# Patient Record
Sex: Female | Born: 2000 | Race: Black or African American | Hispanic: No | Marital: Single | Smoking: Never smoker
Health system: Southern US, Community
[De-identification: ages and names within clinical notes are randomized; demographics above are authoritative.]

---
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
## 2019-10-28 ENCOUNTER — Encounter: Payer: Self-pay | Admitting: Pediatrics

## 2020-10-26 ENCOUNTER — Other Ambulatory Visit: Payer: Self-pay

## 2020-10-26 ENCOUNTER — Encounter (HOSPITAL_COMMUNITY): Payer: Self-pay

## 2020-10-26 ENCOUNTER — Ambulatory Visit (HOSPITAL_COMMUNITY)
Admission: EM | Admit: 2020-10-26 | Discharge: 2020-10-26 | Disposition: A | Discharge status: Home / Self Care | Payer: Medicaid Other | Attending: Physician Assistant | Admitting: Physician Assistant

## 2020-10-26 DIAGNOSIS — S0181XA Laceration without foreign body of other part of head, initial encounter: Secondary | ICD-10-CM

## 2020-10-26 DIAGNOSIS — Z23 Encounter for immunization: Secondary | ICD-10-CM | POA: Diagnosis not present

## 2020-10-26 MED ORDER — TETANUS-DIPHTH-ACELL PERTUSSIS 5-2.5-18.5 LF-MCG/0.5 IM SUSY
PREFILLED_SYRINGE | INTRAMUSCULAR | Status: AC
  Filled 2020-10-26: qty 0.5

## 2020-10-26 MED ORDER — LIDOCAINE HCL (PF) 1 % IJ SOLN
INTRAMUSCULAR | Status: AC
  Filled 2020-10-26: qty 30

## 2020-10-26 MED ORDER — TETANUS-DIPHTH-ACELL PERTUSSIS 5-2.5-18.5 LF-MCG/0.5 IM SUSY
0.5000 mL | PREFILLED_SYRINGE | Freq: Once | INTRAMUSCULAR | Status: AC
  Administered 2020-10-26: 0.5 mL via INTRAMUSCULAR

## 2020-10-26 NOTE — ED Provider Notes (Signed)
[facility]    CSN: [id number] Arrival date & time: 10/26/20  1030      History   Chief Complaint Chief Complaint  Patient presents with   Laceration    HPI Krystal Young is a 20 y.o. female.   Patient presents today with a laceration above her right eye that occurred at approximately 1 AM (12 hours ago).  Reports that she was running away from a bug when she hit her head on a doorknob causing a laceration.  She immediately cleaned the area and then went to bed.  She denies any vision changes, headache, dizziness, loss of consciousness, nausea, vomiting, amnesia surrounding event, difficulty with extraocular movements.     History reviewed. No pertinent past medical history.  Patient Active Problem List   Diagnosis Date Noted   Open left ankle fracture 09/07/2014   Learning disability 12/17/2012   ADHD (attention deficit hyperactivity disorder) 10/21/2012   Prematurity 10/21/2012   Behavior problem 10/21/2012   Receptive expressive language disorder 10/21/2012    History reviewed. No pertinent surgical history.  OB History   No obstetric history on file.      Home Medications    Prior to Admission medications   Medication Sig Start Date End Date Taking? Authorizing Provider  dexmethylphenidate (FOCALIN XR) 5 MG 24 hr capsule Take 1 capsule (5 mg total) by mouth daily. 11/02/12   Leatha Gilding, MD  dexmethylphenidate (FOCALIN XR) 5 MG 24 hr capsule Take 1 capsule (5 mg total) by mouth daily. 11/02/12   Leatha Gilding, MD  guanFACINE (INTUNIV) 1 MG TB24 Take 1 tablet (1 mg total) by mouth 2 (two) times daily. 11/02/12   Leatha Gilding, MD    Family History Family History  Problem Relation Age of Onset   Stroke Paternal Uncle        in early 34's    Social History Social History   Tobacco Use   Smoking status: Never    Passive exposure: Yes   Smokeless tobacco: Never  Substance Use Topics   Alcohol use: No     Allergies   Patient has no known  allergies.   Review of Systems Review of Systems  Constitutional:  Negative for activity change, appetite change and fatigue.  Eyes:  Negative for photophobia, discharge, redness and visual disturbance.  Respiratory:  Negative for cough and shortness of breath.   Cardiovascular:  Negative for chest pain.  Gastrointestinal:  Negative for abdominal pain, diarrhea, nausea and vomiting.  Skin:  Positive for wound.  Neurological:  Negative for dizziness, light-headedness and headaches.    Physical Exam Triage Vital Signs ED Triage Vitals  Enc Vitals Group     BP 10/26/20 1229 128/81     Pulse Rate 10/26/20 1229 62     Resp 10/26/20 1229 17     Temp 10/26/20 1229 98.6 F (37 C)     Temp Source 10/26/20 1229 Oral     SpO2 10/26/20 1229 98 %     Weight --      Height --      Head Circumference --      Peak Flow --      Pain Score 10/26/20 1232 0     Pain Loc --      Pain Edu? --      Excl. in GC? --    No data found.  Updated Vital Signs BP 128/81 (BP Location: Left Arm)   Pulse 62   Temp  98.6 F (37 C) (Oral)   Resp 17   LMP 10/12/2020   SpO2 98%   Visual Acuity Right Eye Distance:   Left Eye Distance:   Bilateral Distance:    Right Eye Near:   Left Eye Near:    Bilateral Near:     Physical Exam Vitals reviewed.  Constitutional:      General: She is awake. She is not in acute distress.    Appearance: Normal appearance. She is normal weight. She is not ill-appearing.  HENT:     Head: Normocephalic and atraumatic.  Cardiovascular:     Rate and Rhythm: Normal rate and regular rhythm.     Heart sounds: Normal heart sounds, S1 normal and S2 normal. No murmur heard. Pulmonary:     Effort: Pulmonary effort is normal.     Breath sounds: Normal breath sounds. No wheezing, rhonchi or rales.  Abdominal:     Palpations: Abdomen is soft.     Tenderness: There is no abdominal tenderness.  Skin:    Findings: Laceration present.     Comments: 3 cm laceration noted  superior eyelid.  Psychiatric:        Behavior: Behavior is cooperative.        UC Treatments / Results  Labs (all labs ordered are listed, but only abnormal results are displayed) Labs Reviewed - No data to display  EKG   Radiology No results found.  Procedures Laceration Repair  Date/Time: 10/26/2020 1:25 PM Performed by: Jeani Hawking, PA-C Authorized by: Jeani Hawking, PA-C   Consent:    Consent obtained:  Verbal   Consent given by:  Patient   Risks, benefits, and alternatives were discussed: yes     Risks discussed:  Pain, need for additional repair and poor cosmetic result   Alternatives discussed:  No treatment, observation and referral Universal protocol:    Procedure explained and questions answered to patient or proxy's satisfaction: yes     Patient identity confirmed:  Verbally with patient Anesthesia:    Anesthesia method:  Local infiltration   Local anesthetic:  Lidocaine 1% w/o epi Laceration details:    Location:  Face   Face location:  R upper eyelid   Extent:  Superficial   Length (cm):  3 Pre-procedure details:    Preparation:  Patient was prepped and draped in usual sterile fashion Exploration:    Hemostasis achieved with:  Direct pressure   Contaminated: no   Treatment:    Area cleansed with:  Chlorhexidine   Amount of cleaning:  Standard   Irrigation solution:  Sterile saline   Irrigation volume:  5 mL   Irrigation method:  Syringe   Debridement:  None   Undermining:  None Skin repair:    Repair method:  Sutures   Suture size:  7-0   Suture material:  Prolene   Suture technique:  Simple interrupted   Number of sutures:  3 Approximation:    Approximation:  Close Repair type:    Repair type:  Simple Post-procedure details:    Dressing:  Open (no dressing)   Procedure completion:  Tolerated well, no immediate complications (including critical care time)  Medications Ordered in UC Medications  Tdap (BOOSTRIX) injection 0.5 mL  (has no administration in time range)    Initial Impression / Assessment and Plan / UC Course  I have reviewed the triage vital signs and the nursing notes.  Pertinent labs & imaging results that were available during my care of the  patient were reviewed by me and considered in my medical decision making (see chart for details).     Laceration repaired in clinic (see procedure note above).  Patient encouraged to keep area clean.  Tetanus was updated today.  Discussed signs/symptoms of infection that warrant reevaluation.  She is to return in 3 to 5 days to have sutures removed by our clinic.  Strict return precautions given to which patient expressed understanding.  Final Clinical Impressions(s) / UC Diagnoses   Final diagnoses:  Facial laceration, initial encounter     Discharge Instructions      Your tetanus was updated today.  I placed 3 sutures.  You will need to return in 3 to 5 days to have this removed by our clinic.  If you have any signs of infection including bleeding, swelling, pain, drainage you need to be reevaluated.  Keep area clean and dry.  If you have any worsening symptoms you need to go to the emergency room.     ED Prescriptions   None    PDMP not reviewed this encounter.   Jeani Hawking, PA-C 10/26/20 1329

## 2020-10-26 NOTE — Discharge Instructions (Addendum)
Your tetanus was updated today.  I placed 3 sutures.  You will need to return in 3 to 5 days to have this removed by our clinic.  If you have any signs of infection including bleeding, swelling, pain, drainage you need to be reevaluated.  Keep area clean and dry.  If you have any worsening symptoms you need to go to the emergency room.

## 2020-10-26 NOTE — ED Triage Notes (Signed)
Pt in with laceration above right eye that occurred yesterday when she hit her head on door knob  Denies any pain or vision changes  No active bleeding

## 2020-11-04 ENCOUNTER — Other Ambulatory Visit: Payer: Self-pay

## 2020-11-04 ENCOUNTER — Ambulatory Visit (HOSPITAL_COMMUNITY)
Admission: EM | Admit: 2020-11-04 | Discharge: 2020-11-04 | Disposition: A | Discharge status: Home / Self Care | Payer: Medicaid Other

## 2020-11-04 NOTE — ED Triage Notes (Signed)
Pt presents for suture removal.

## 2021-08-24 ENCOUNTER — Ambulatory Visit (HOSPITAL_COMMUNITY)
Admission: EM | Admit: 2021-08-24 | Discharge: 2021-08-24 | Disposition: A | Discharge status: Home / Self Care | Payer: Medicaid Other | Attending: Student | Admitting: Student

## 2021-08-24 ENCOUNTER — Encounter (HOSPITAL_COMMUNITY): Payer: Self-pay | Admitting: Emergency Medicine

## 2021-08-24 DIAGNOSIS — H00014 Hordeolum externum left upper eyelid: Secondary | ICD-10-CM

## 2021-08-24 MED ORDER — ERYTHROMYCIN 5 MG/GM OP OINT: TOPICAL_OINTMENT | OPHTHALMIC | 0 refills | Status: AC

## 2021-08-24 NOTE — ED Triage Notes (Signed)
Pt c/o swelling to bilat eye lids. Left eye started month ago and right eye started few days ago. Denies drainage or watering or visual impairments .

## 2021-08-24 NOTE — ED Provider Notes (Signed)
[facility]    CSN: [id number] Arrival date & time: 08/24/21  1316      History   Chief Complaint Chief Complaint  Patient presents with   Facial Swelling    HPI Krystal Young is a 21 y.o. female presenting with upper lid swelling. L upper lid swelling x 3 months; R upper lid swelling x3 days. Previously worse glasses but no longer, does not wear contacts. Has attempted warm compresses without relief.  Denies photophobia, foreign body sensation, eye redness, eye crusting in the morning, eye pain, eye pain with movement, injury to eye, vision changes, double vision, excessive tearing, burning eyes   HPI  History reviewed. No pertinent past medical history.  Patient Active Problem List   Diagnosis Date Noted   Open left ankle fracture 09/07/2014   Learning disability 12/17/2012   ADHD (attention deficit hyperactivity disorder) 10/21/2012   Prematurity 10/21/2012   Behavior problem 10/21/2012   Receptive expressive language disorder 10/21/2012    History reviewed. No pertinent surgical history.  OB History   No obstetric history on file.      Home Medications    Prior to Admission medications   Medication Sig Start Date End Date Taking? Authorizing Provider  erythromycin ophthalmic ointment Place a 1/2 inch ribbon of ointment into the lower eyelid at bedtime x7 days 08/24/21 08/31/21 Yes Rhys Martini, PA-C  dexmethylphenidate (FOCALIN XR) 5 MG 24 hr capsule Take 1 capsule (5 mg total) by mouth daily. 11/02/12   Leatha Gilding, MD  dexmethylphenidate (FOCALIN XR) 5 MG 24 hr capsule Take 1 capsule (5 mg total) by mouth daily. 11/02/12   Leatha Gilding, MD  guanFACINE (INTUNIV) 1 MG TB24 Take 1 tablet (1 mg total) by mouth 2 (two) times daily. 11/02/12   Leatha Gilding, MD    Family History Family History  Problem Relation Age of Onset   Stroke Paternal Uncle        in early 18's    Social History Social History   Tobacco Use   Smoking status: Never     Passive exposure: Yes   Smokeless tobacco: Never  Substance Use Topics   Alcohol use: No     Allergies   Patient has no known allergies.   Review of Systems Review of Systems  All other systems reviewed and are negative.    Physical Exam Triage Vital Signs ED Triage Vitals  Enc Vitals Group     BP 08/24/21 1429 121/69     Pulse Rate 08/24/21 1429 100     Resp 08/24/21 1429 14     Temp 08/24/21 1429 98 F (36.7 C)     Temp Source 08/24/21 1429 Oral     SpO2 08/24/21 1429 100 %     Weight --      Height --      Head Circumference --      Peak Flow --      Pain Score 08/24/21 1427 2     Pain Loc --      Pain Edu? --      Excl. in GC? --    No data found.  Updated Vital Signs BP 121/69 (BP Location: Left Arm)   Pulse 100   Temp 98 F (36.7 C) (Oral)   Resp 14   LMP 05/25/2021   SpO2 100%   Visual Acuity Right Eye Distance:   Left Eye Distance:   Bilateral Distance:    Right Eye Near:  Left Eye Near:    Bilateral Near:     Physical Exam Vitals reviewed.  Constitutional:      Appearance: Normal appearance.  HENT:     Head: Normocephalic and atraumatic.     Right Ear: Tympanic membrane, ear canal and external ear normal. There is no impacted cerumen.     Left Ear: Tympanic membrane, ear canal and external ear normal. There is no impacted cerumen.     Nose: Nose normal. No congestion.     Mouth/Throat:     Pharynx: Oropharynx is clear. No posterior oropharyngeal erythema.  Eyes:     General: Lids are normal. Lids are everted, no foreign bodies appreciated. Vision grossly intact. Gaze aligned appropriately. No visual field deficit.       Right eye: No foreign body, discharge or hordeolum.        Left eye: Hordeolum present.No foreign body or discharge.     Extraocular Movements: Extraocular movements intact.     Right eye: Normal extraocular motion and no nystagmus.     Left eye: Normal extraocular motion and no nystagmus.     Conjunctiva/sclera:  Conjunctivae normal.     Right eye: Right conjunctiva is not injected. No chemosis, exudate or hemorrhage.    Left eye: Left conjunctiva is not injected. No chemosis, exudate or hemorrhage.    Pupils: Pupils are equal, round, and reactive to light.     Visual Fields: Right eye visual fields normal and left eye visual fields normal.     Comments: Well circumscribed hordeolum L upper lid. The R upper lid is mildly edematous along the lashline, worse medially. No obvious style or chalazion. No conjunctival injection or hemorrhage. PERRLA, EOMI without pain. No orbital tenderness. No proptosis. Visual acuity grossly intact.  Cardiovascular:     Rate and Rhythm: Normal rate and regular rhythm.     Heart sounds: Normal heart sounds.  Pulmonary:     Effort: Pulmonary effort is normal.     Breath sounds: Normal breath sounds.  Neurological:     General: No focal deficit present.     Mental Status: She is alert.  Psychiatric:        Mood and Affect: Mood normal.        Behavior: Behavior normal.        Thought Content: Thought content normal.        Judgment: Judgment normal.      UC Treatments / Results  Labs (all labs ordered are listed, but only abnormal results are displayed) Labs Reviewed - No data to display  EKG   Radiology No results found.  Procedures Procedures (including critical care time)  Medications Ordered in UC Medications - No data to display  Initial Impression / Assessment and Plan / UC Course  I have reviewed the triage vital signs and the nursing notes.  Pertinent labs & imaging results that were available during my care of the patient were reviewed by me and considered in my medical decision making (see chart for details).     This patient is a very pleasant 21 y.o. year old female presenting with L upper lid hordeolum. Also appears to have blepharitis R upper lid. The swelling is limited to the lashline; there is no proptosis or pain with EOMIs, so low  concern for preseptal cellulitis or orbital cellulitis. Will manage with erythromycin ointment. Continue warm compresses. ED return precautions discussed. Patient verbalizes understanding and agreement.   Final Clinical Impressions(s) / UC Diagnoses  Final diagnoses:  Hordeolum externum of left upper eyelid     Discharge Instructions      -You have a stye (hordeolum). This is an inflamed oil gland noted on the margin of the eyelid at the level of the eyelashes.  -We are treating it with an antibiotic ointment called erythromycin.  Use this once nightly for about 7 days.  Pull down the lower eyelid, and place about half an inch inside.  This will be messy, so press the remaining ointment around the eye.  You can wash your face with gentle soap and water in the morning to wash off any remaining ointment. -Warm compresses -Seek additional medical attention if symptoms get worse, like eye pain, eye lid swelling, vision changes. Follow-up with your eye doctor if possible, but we're also happy to see you!    ED Prescriptions     Medication Sig Dispense Auth. Provider   erythromycin ophthalmic ointment Place a 1/2 inch ribbon of ointment into the lower eyelid at bedtime x7 days 3.5 g Rhys Martini, PA-C      PDMP not reviewed this encounter.   Rhys Martini, PA-C 08/24/21 [phone]

## 2021-08-24 NOTE — Discharge Instructions (Addendum)
-  You have a stye (hordeolum). This is an inflamed oil gland noted on the margin of the eyelid at the level of the eyelashes.  -We are treating it with an antibiotic ointment called erythromycin.  Use this once nightly for about 7 days.  Pull down the lower eyelid, and place about half an inch inside.  This will be messy, so press the remaining ointment around the eye.  You can wash your face with gentle soap and water in the morning to wash off any remaining ointment. -Warm compresses -Seek additional medical attention if symptoms get worse, like eye pain, eye lid swelling, vision changes. Follow-up with your eye doctor if possible, but we're also happy to see you!
